# Patient Record
Sex: Female | Born: 1940 | Race: Black or African American | Hispanic: No | Marital: Married | Smoking: Never smoker
Health system: Southern US, Community
[De-identification: ages and names within clinical notes are randomized; demographics above are authoritative.]

## PROBLEM LIST (undated history)

## (undated) DIAGNOSIS — I10 Essential (primary) hypertension: Secondary | ICD-10-CM

## (undated) DIAGNOSIS — K219 Gastro-esophageal reflux disease without esophagitis: Secondary | ICD-10-CM

## (undated) DIAGNOSIS — M1990 Unspecified osteoarthritis, unspecified site: Secondary | ICD-10-CM

## (undated) HISTORY — DX: Unspecified osteoarthritis, unspecified site: M19.90

## (undated) HISTORY — DX: Essential (primary) hypertension: I10

## (undated) HISTORY — DX: Gastro-esophageal reflux disease without esophagitis: K21.9

---

## 1972-08-07 HISTORY — PX: ABDOMINAL HYSTERECTOMY: SHX81

## 1987-08-08 HISTORY — PX: BUNIONECTOMY: SHX129

## 2003-08-21 ENCOUNTER — Ambulatory Visit: Admission: RE | Admit: 2003-08-21 | Discharge: 2003-08-21 | Payer: Self-pay | Admitting: Obstetrics & Gynecology

## 2005-12-23 ENCOUNTER — Emergency Department (HOSPITAL_COMMUNITY): Admission: EM | Admit: 2005-12-23 | Discharge: 2005-12-23 | Payer: Self-pay | Admitting: Emergency Medicine

## 2006-01-23 ENCOUNTER — Ambulatory Visit (HOSPITAL_COMMUNITY): Admission: RE | Admit: 2006-01-23 | Discharge: 2006-01-23 | Payer: Self-pay | Admitting: Family Medicine

## 2006-05-29 ENCOUNTER — Emergency Department (HOSPITAL_COMMUNITY): Admission: EM | Admit: 2006-05-29 | Discharge: 2006-05-29 | Payer: Self-pay | Admitting: Emergency Medicine

## 2006-06-01 ENCOUNTER — Emergency Department (HOSPITAL_COMMUNITY): Admission: EM | Admit: 2006-06-01 | Discharge: 2006-06-01 | Payer: Self-pay | Admitting: Emergency Medicine

## 2007-09-04 ENCOUNTER — Ambulatory Visit (HOSPITAL_COMMUNITY): Admission: RE | Admit: 2007-09-04 | Discharge: 2007-09-04 | Payer: Self-pay | Admitting: Family Medicine

## 2007-09-16 ENCOUNTER — Ambulatory Visit (HOSPITAL_COMMUNITY): Admission: RE | Admit: 2007-09-16 | Discharge: 2007-09-16 | Payer: Self-pay | Admitting: Family Medicine

## 2007-09-19 IMAGING — CR DG CHEST 2V
2 series · 2 of 2 positions shown · non-contrast
Comparison: None.

CLINICAL DATA: 65 year-old-female with dizziness, cough, shortness of breath. 
 CHEST - 2 VIEW:

[view not recorded (1 of 2)]
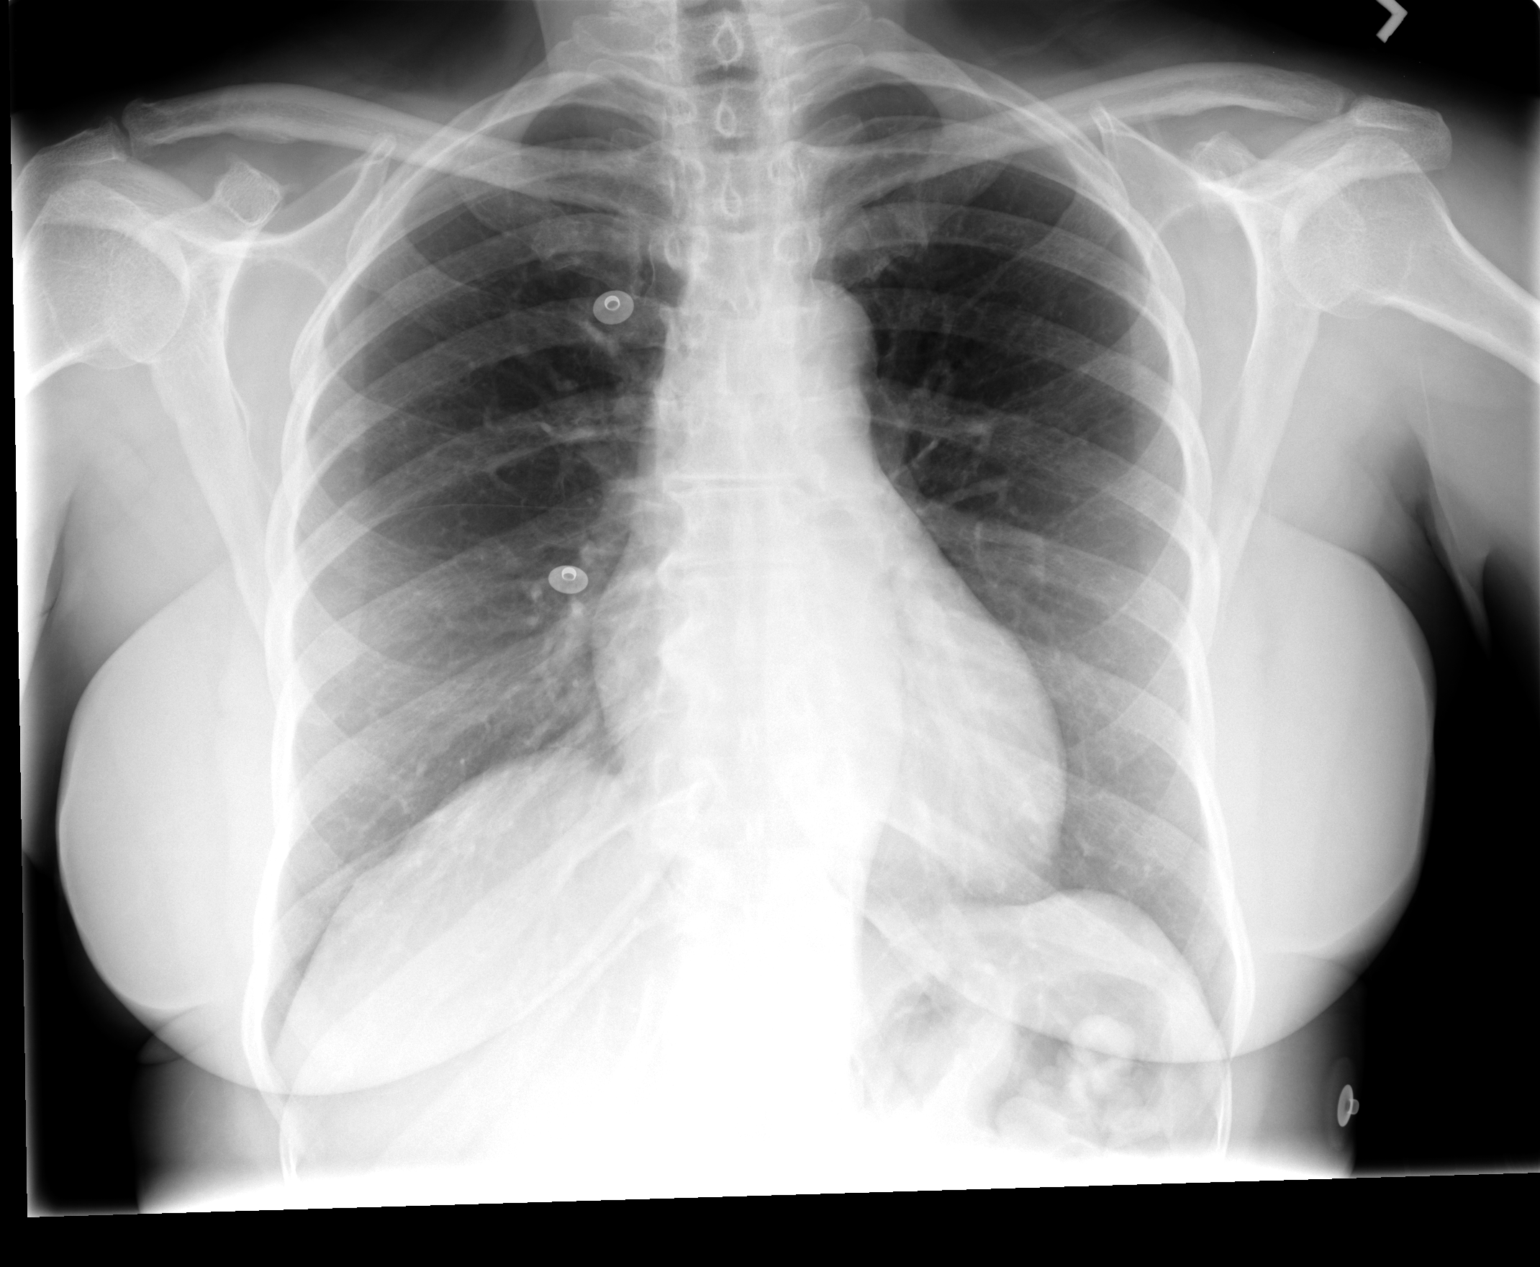

[view not recorded (2 of 2)]
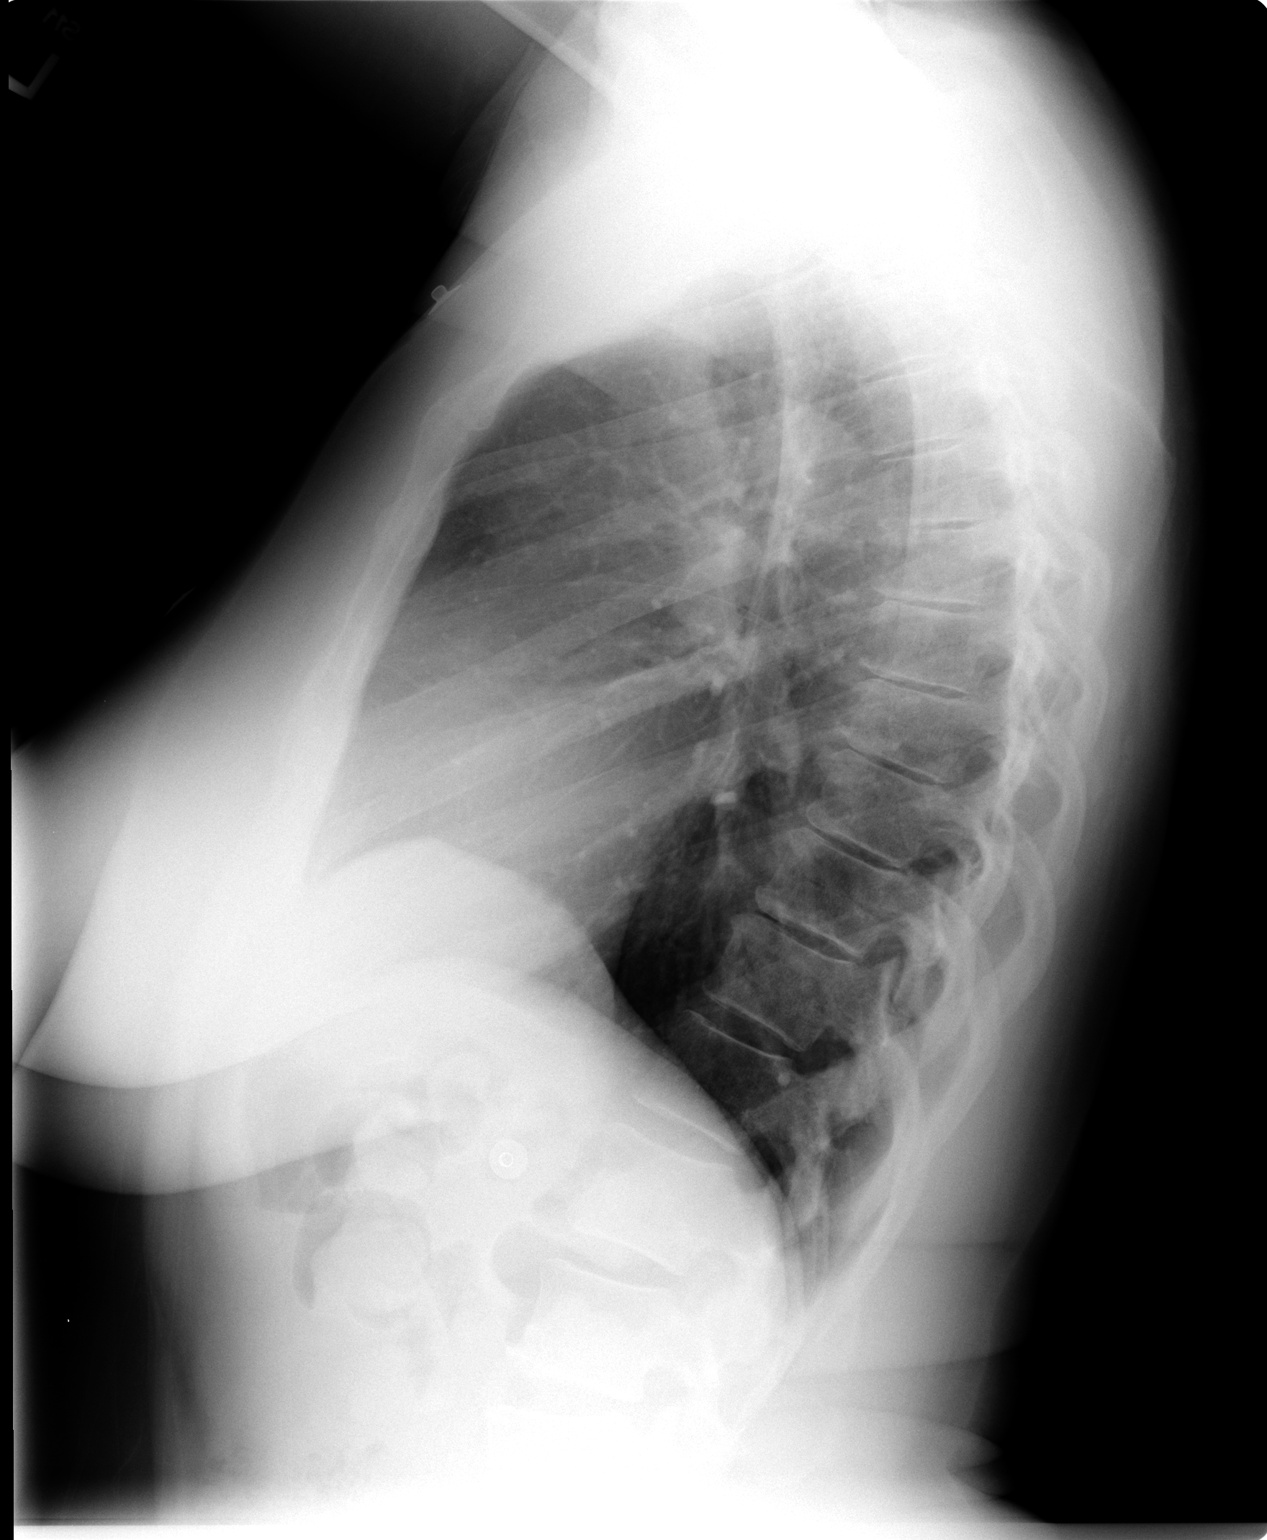

[2 of 2 positions shown; findings below may reference images not displayed]

FINDINGS: The heart size and mediastinal contours are within normal limits.  Both lungs are clear.  The visualized skeletal structures are unremarkable.
IMPRESSION: No active cardiopulmonary disease.

## 2007-09-19 IMAGING — CT CT HEAD W/O CM
1 series · 15 of 28 positions shown, 19 images · IV contrast (agent unspecified)
Comparison: None.

CLINICAL DATA: Dizziness.  Legs feel weak.
 HEAD CT WITHOUT CONTRAST:
TECHNIQUE: Contiguous axial images were obtained from the base of the skull through the vertex, according to standard protocol, without contrast.

[Series 1830: — · axial · 0.49mm/px · z∈[-621,-496]mm · 15 of 28 slices shown, 19 images]
[im 2/28  brain]
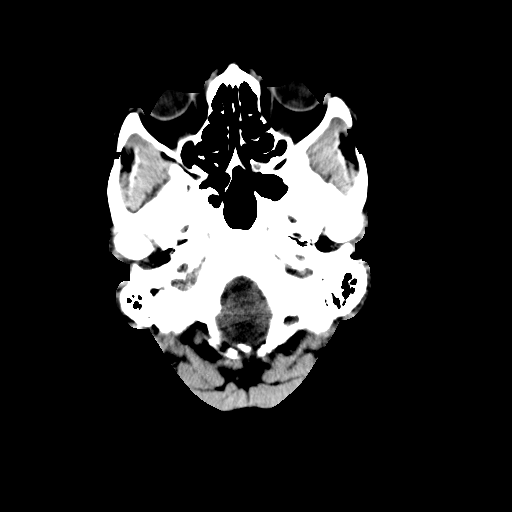
[im 2/28  bone]
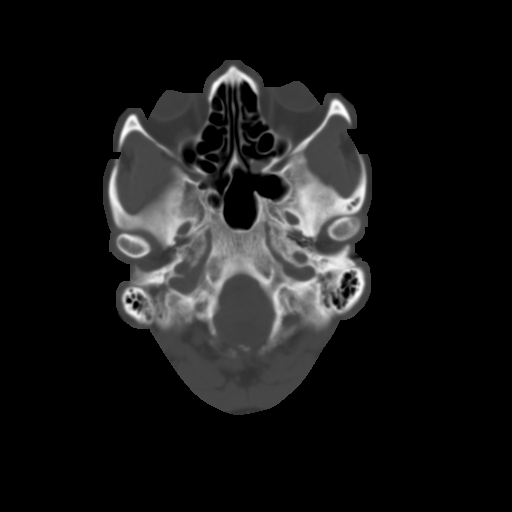
[im 4/28  brain]
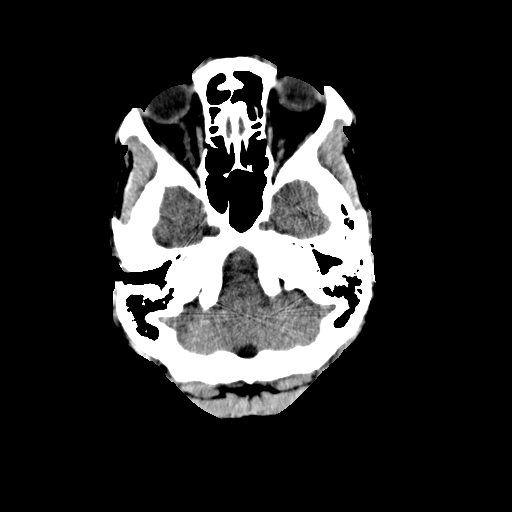
[im 6/28  brain]
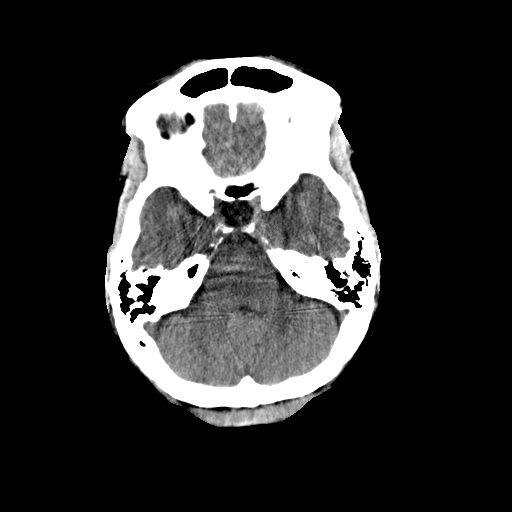
[im 8/28  brain]
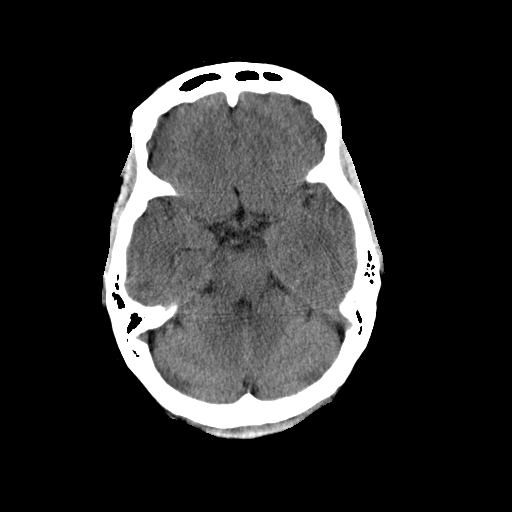
[im 9/28  brain]
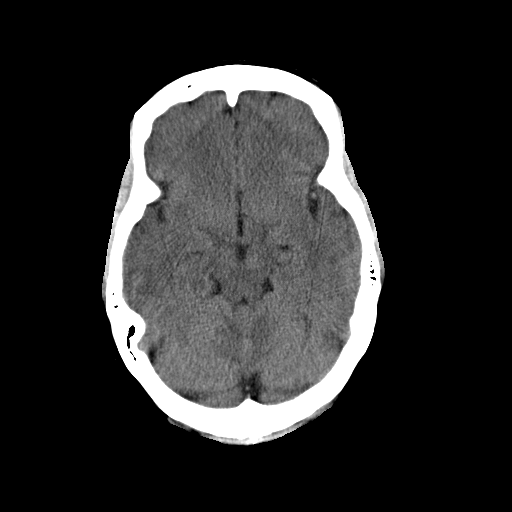
[im 9/28  bone]
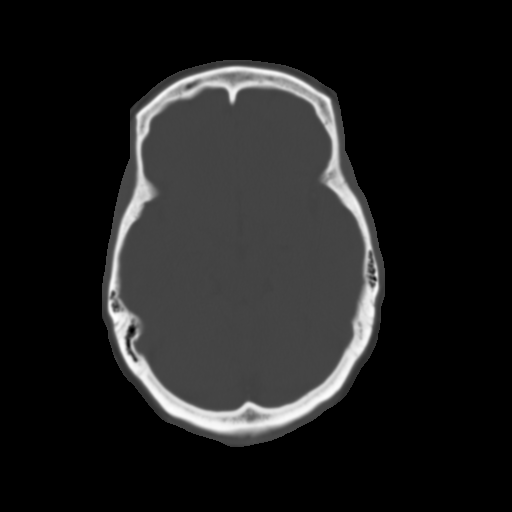
[im 11/28  brain]
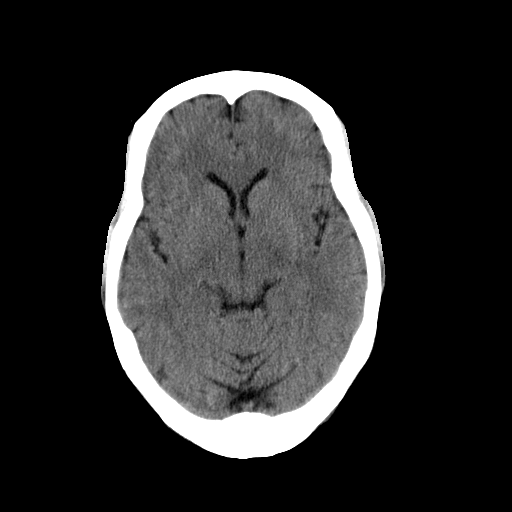
[im 13/28  brain]
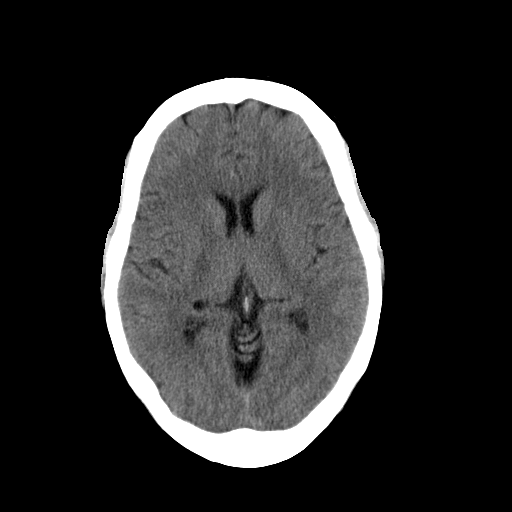
[im 15/28  brain]
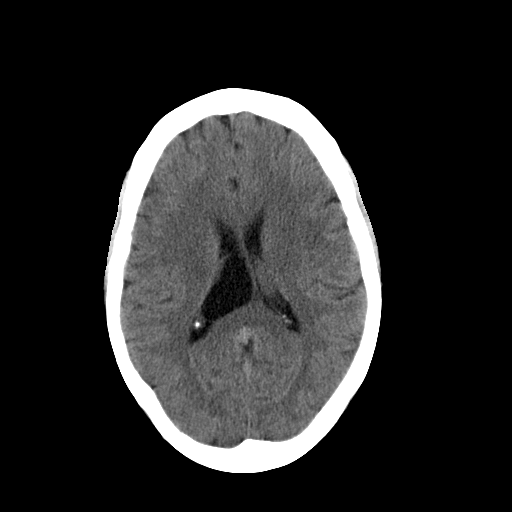
[im 16/28  brain]
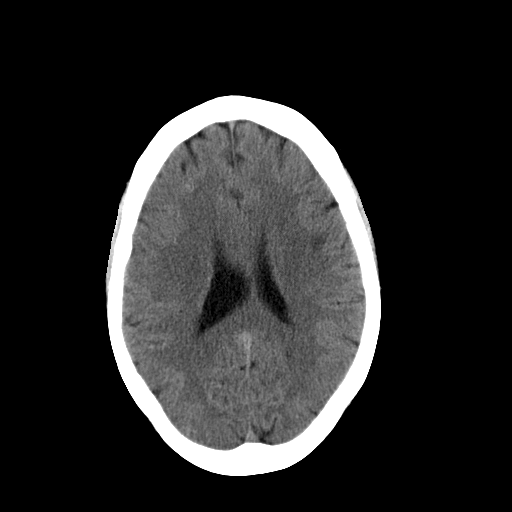
[im 16/28  bone]
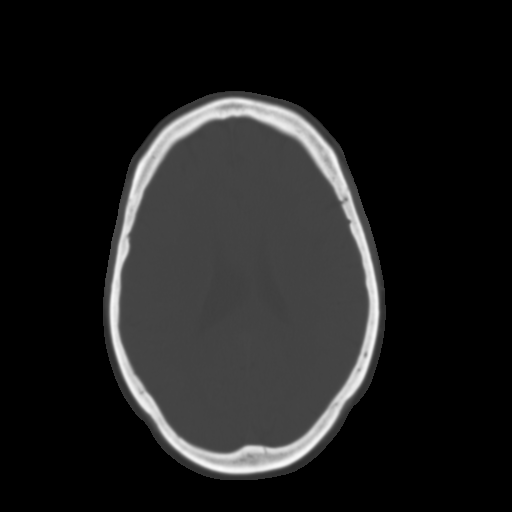
[im 18/28  brain]
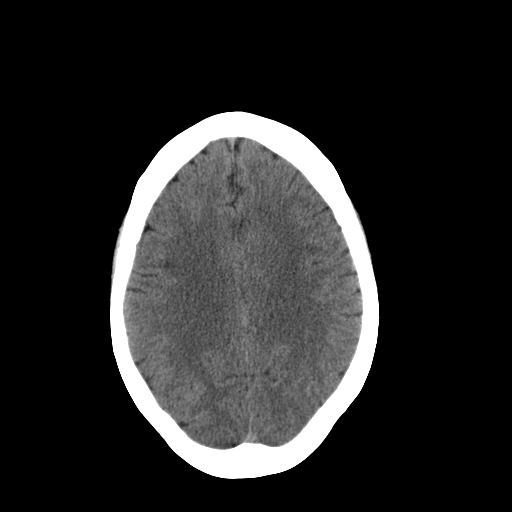
[im 20/28  brain]
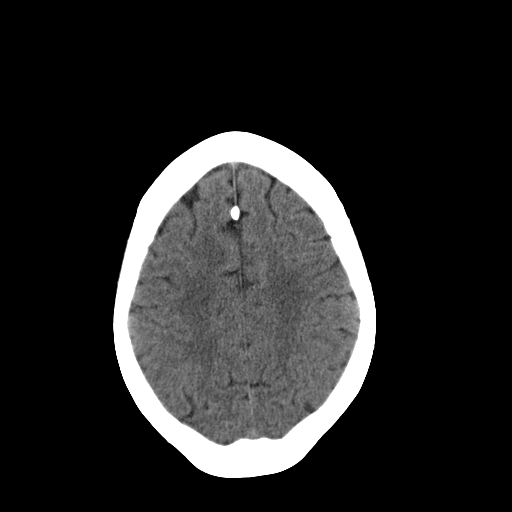
[im 21/28  brain]
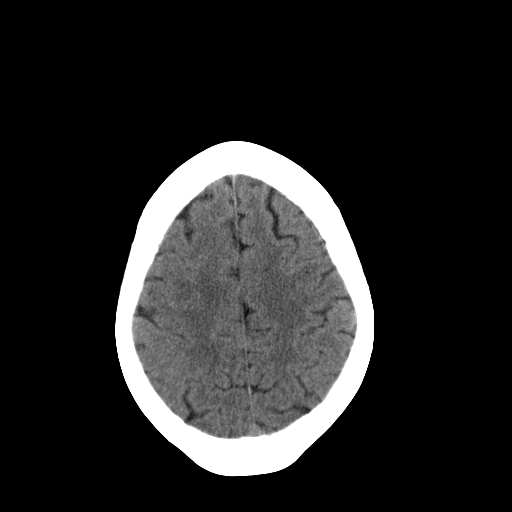
[im 23/28  brain]
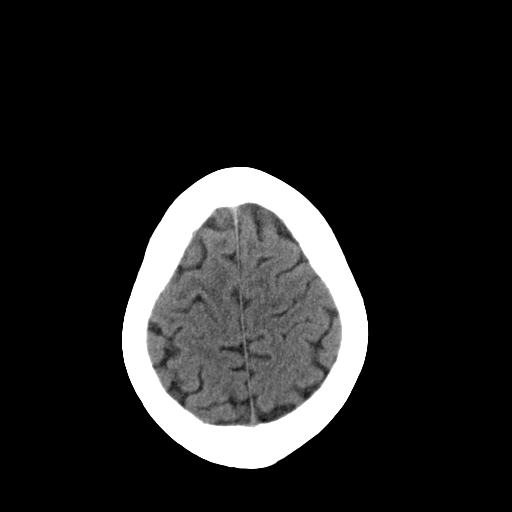
[im 23/28  bone]
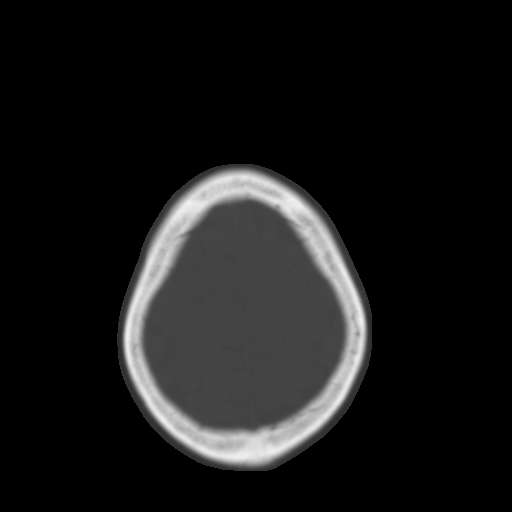
[im 25/28  brain]
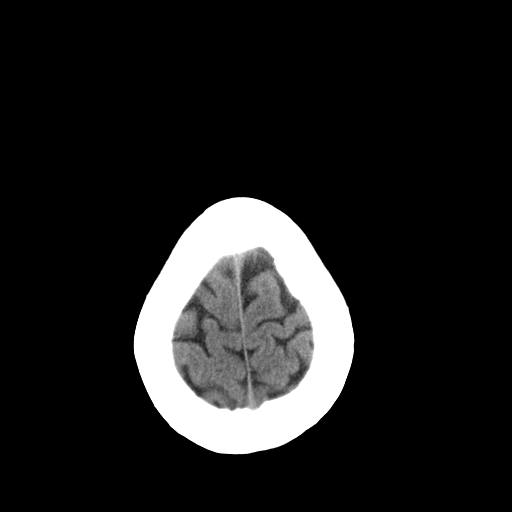
[im 27/28  brain]
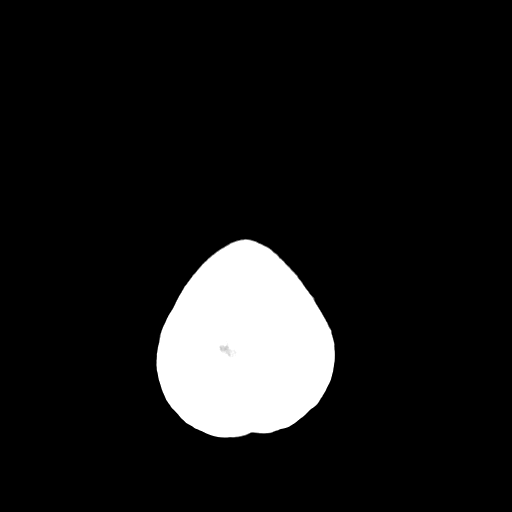

[15 of 28 positions shown; findings below may reference images not displayed]

FINDINGS: The sella turcica is fluid filled.  Partial empty sella may be present.
 There is a 3.4 x 2.2 cm cystic abnormality within the right lateral ventricle, causing mass effect upon adjacent structures such as the splenium of the corpus callosum as well as the septum pellucidum.  There is midline shift to the left, only involving the septum pellucidum.  There is no intracranial hemorrhage.  There is no hydrocephalus.  There are no suspicious areas of low density to suggest acute stroke.  The mastoid air cells and paranasal sinuses are clear.
IMPRESSION: 1.  Partial empty sella may be present.  Further characterize with MR.
 2.  There is a cystic abnormality in the right lateral ventricle causing mass effect upon the septum pellucidum and splenium of the corpus callosum.  MRI is warranted to further characterize.

## 2007-11-05 ENCOUNTER — Ambulatory Visit (HOSPITAL_COMMUNITY): Admission: RE | Admit: 2007-11-05 | Discharge: 2007-11-05 | Payer: Self-pay | Admitting: General Surgery

## 2009-09-27 ENCOUNTER — Encounter: Payer: Self-pay | Admitting: Family Medicine

## 2009-10-07 ENCOUNTER — Ambulatory Visit (HOSPITAL_COMMUNITY): Admission: RE | Admit: 2009-10-07 | Discharge: 2009-10-07 | Payer: Self-pay | Admitting: Family Medicine

## 2009-10-22 ENCOUNTER — Encounter: Payer: Self-pay | Admitting: Family Medicine

## 2010-04-27 ENCOUNTER — Ambulatory Visit: Payer: Self-pay | Admitting: Family Medicine

## 2010-04-27 DIAGNOSIS — I10 Essential (primary) hypertension: Secondary | ICD-10-CM | POA: Insufficient documentation

## 2010-04-27 DIAGNOSIS — K219 Gastro-esophageal reflux disease without esophagitis: Secondary | ICD-10-CM

## 2010-04-27 DIAGNOSIS — R062 Wheezing: Secondary | ICD-10-CM

## 2010-04-27 DIAGNOSIS — R1013 Epigastric pain: Secondary | ICD-10-CM

## 2010-07-27 ENCOUNTER — Ambulatory Visit: Payer: Self-pay | Admitting: Family Medicine

## 2010-08-27 ENCOUNTER — Encounter: Payer: Self-pay | Admitting: Obstetrics & Gynecology

## 2010-08-28 ENCOUNTER — Encounter: Payer: Self-pay | Admitting: Family Medicine

## 2010-09-06 NOTE — Assessment & Plan Note (Signed)
Summary: to be est/ear wax problem/njr   Vital Signs:  Patient profile:   70 year old female Menstrual status:  perimenopausal LMP:     08/07/1973 Height:      64.25 inches Weight:      189 pounds BMI:     32.31 Temp:     98.3 degrees F oral Pulse rate:   72 / minute Pulse rhythm:   regular Resp:     12 per minute BP sitting:   150 / 80  (left arm) Cuff size:   regular  Vitals Entered By: Sid Falcon LPN (April 27, 2010 8:55 AM)  Nutrition Counseling: Patient's BMI is greater than 25 and therefore counseled on weight management options.  CC: New to establish Is Patient Diabetic? No LMP (date): 08/07/1973     Menstrual Status perimenopausal Enter LMP: 08/07/1973   History of Present Illness: New patient to establish care.  Patient has history of hypertension. Reported history of GERD. She has taken PPI intermittently in the past with improvement. Has some mild arthritis issues involving lower extremities. Surgical history for partial hysterectomy 1974. Foot surgery for bunion 1989. No known drug allergies. Current medications reviewed.  Patient has issue of intermittent midepigastric and occasional right upper quadrant abdominal pains off and on for the past several months. Symptoms are actually better now and were improved with PPI. She reports she had MRI upper abdomen from prior physician which was unremarkable. She has not had any postprandial symptoms, nausea, vomiting, or appetite change, weight change, or a stool changes. No history of gallstones.  Recent issues of both ears feel full of pressure. Intermittent tinnitus. No hearing loss. History of wax buildup in the past.  2 years history of some possible expiratory wheezes. Possibly worse at night. Some dyspnea with activity. No chest pain. No cough. Denies history of asthma. No known specific allergies. no hx of PFTs.  nonsmoker.  Preventive Screening-Counseling & Management  Alcohol-Tobacco     Smoking  Status: never  Caffeine-Diet-Exercise     Does Patient Exercise: yes  Past History:  Social History: Last updated: 04/27/2010 Occupation:  Retired Film/video editor Alcohol use-yes Alcohol use-no Regular exercise-yes  Risk Factors: Exercise: yes (04/27/2010)  Risk Factors: Smoking Status: never (04/27/2010)  Past Medical History: Arthritis GERD Hypertension  Past Surgical History: Hysterectomy, partial 1974 Bunionectomy, 1989 PMH-FH-SH reviewed for relevance  Social History: Occupation:  Retired Film/video editor Alcohol use-yes Alcohol use-no Regular exercise-yes Smoking Status:  never Occupation:  employed Does Patient Exercise:  yes  Review of Systems  The patient denies anorexia, fever, weight loss, vision loss, hoarseness, chest pain, syncope, peripheral edema, prolonged cough, headaches, hemoptysis, abdominal pain, melena, hematochezia, severe indigestion/heartburn, incontinence, muscle weakness, and depression.    Physical Exam  General:  Well-developed,well-nourished,in no acute distress; alert,appropriate and cooperative throughout examination Head:  Normocephalic and atraumatic without obvious abnormalities. No apparent alopecia or balding. Eyes:  pupils equal, pupils round, and pupils reactive to light.   Ears:  cerumen impaction bilaterally Irrigated with removal of cerumen. Mouth:  Oral mucosa and oropharynx without lesions or exudates.  Teeth in good repair. Neck:  No deformities, masses, or tenderness noted. Lungs:  Normal respiratory effort, chest expands symmetrically. Lungs are clear to auscultation, no crackles or wheezes. Heart:  normal rate, regular rhythm, and no murmur.   Abdomen:  soft, non-tender, normal bowel sounds, no masses, no guarding, no rigidity, no hepatomegaly, and no splenomegaly.   Msk:  No deformity or scoliosis noted of thoracic or  lumbar spine.   Extremities:  no edema or clubbing Neurologic:  alert & oriented X3,  cranial nerves II-XII intact, and gait normal.   Skin:  no rashes and no suspicious lesions.   Cervical Nodes:  No lymphadenopathy noted Psych:  normally interactive, good eye contact, not anxious appearing, and not depressed appearing.     Impression & Recommendations:  Problem # 1:  HYPERTENSION (ICD-401.9) monitor at home.  Work on weight loss and reassess 3 months. Her updated medication list for this problem includes:    Hydrochlorothiazide 12.5 Mg Caps (Hydrochlorothiazide) ..... Once daily    Metoprolol Tartrate 25 Mg Tabs (Metoprolol tartrate) ..... Once daily  Problem # 2:  GERD (ICD-530.81) Get back on PPI.  Problem # 3:  WHEEZING (ICD-786.07) schedule spirometry.  Problem # 4:  ABDOMINAL PAIN, EPIGASTRIC (ICD-789.06) Get records of prior w/u.  Get back on PPI.  If symptoms not resolved on PPI, GI referral.  Problem # 5:  CERUMEN IMPACTION (ICD-380.4) Assessment: Improved removed with irrigation.  Problem # 6:  Preventive Health Care (ICD-V70.0) flu vaccine given.  Complete Medication List: 1)  Hydrochlorothiazide 12.5 Mg Caps (Hydrochlorothiazide) .... Once daily 2)  Metoprolol Tartrate 25 Mg Tabs (Metoprolol tartrate) .... Once daily  Other Orders: Flu Vaccine 71yrs + MEDICARE PATIENTS (W4132) Administration Flu vaccine - MCR (G4010)  Patient Instructions: 1)  Please schedule a follow-up appointment in 3 months .  2)  Check your  Blood Pressure regularly . If it is above: 140/90  you should make an appointment. 3)  We will call you regarding spirometry (lung function evaluation) 4)  Consider starting back acid reducer Dr Nobie Putnam gave you.  If no relief with that be in touch with me.     Flu Vaccine Consent Questions     Do you have a history of severe allergic reactions to this vaccine? no    Any prior history of allergic reactions to egg and/or gelatin? no    Do you have a sensitivity to the preservative Thimersol? no    Do you have a past history of  Guillan-Barre Syndrome? no    Do you currently have an acute febrile illness? no    Have you ever had a severe reaction to latex? no    Vaccine information given and explained to patient? yes    Are you currently pregnant? no    Lot Number:[id number]   Exp Date:02/04/2011   Site Given  Left Deltoid IMflu

## 2010-09-06 NOTE — Letter (Signed)
Summary: [facility]   Imported By: Maryln Gottron 05/19/2010 14:58:29  _____________________________________________________________________  External Attachment:    Type:   Image     Comment:   External Document

## 2010-09-08 NOTE — Letter (Signed)
Summary: Records from [facility] 2009 - 2011  Records from [facility] 2009 - 2011   Imported By: Maryln Gottron 08/04/2010 10:34:39  _____________________________________________________________________  External Attachment:    Type:   Image     Comment:   External Document

## 2010-09-08 NOTE — Assessment & Plan Note (Signed)
Summary: 3 month rov/njr---PT RSC (BMP) // RS   Vital Signs:  Patient profile:   70 year old female Menstrual status:  perimenopausal Weight:      194 pounds Temp:     98.7 degrees F oral BP sitting:   120 / 86  (left arm) Cuff size:   regular  Vitals Entered By: Sid Falcon LPN (July 27, 2010 2:07 PM)  History of Present Illness: Bilaeral lower extrem pain for 2weeks.  Pain is bilateral.   Radiates from buttock area down post thigh/hamstring bil. First noticed after intercourse. Worse sitting.  Pain is achy.  ?mild weakness. Possible some better with walking.  No injury. No incontinence.  No low back pain.  Pt has separate issue of freq wheezing mostly noted at home and ? worse in her bedroom.  No indoor pets.  No clear triggers.  Symptoms are frequent. Has not tried any meds.  ?hx of mild intermittent asthma.  Symptoms are becoming  more frequent.    BP stable on meds .  Complinat with meds.  Allergies (verified): No Known Drug Allergies  Past History:  Past Medical History: Last updated: 04/27/2010 Arthritis GERD Hypertension  Past Surgical History: Last updated: 04/27/2010 Hysterectomy, partial 1974 Bunionectomy, 1989  Social History: Last updated: 04/27/2010 Occupation:  Retired Film/video editor Alcohol use-yes Alcohol use-no Regular exercise-yes  Risk Factors: Exercise: yes (04/27/2010)  Risk Factors: Smoking Status: never (04/27/2010) PMH-FH-SH reviewed for relevance  Review of Systems  The patient denies anorexia, fever, weight loss, abdominal pain, hematuria, hoarseness, chest pain, syncope, dyspnea on exertion, peripheral edema, prolonged cough, headaches, and hemoptysis.    Physical Exam  General:  Well-developed,well-nourished,in no acute distress; alert,appropriate and cooperative throughout examination Neck:  No deformities, masses, or tenderness noted. Lungs:  Normal respiratory effort, chest expands symmetrically. Lungs are  clear to auscultation, no crackles or wheezes. Heart:  Normal rate and regular rhythm. S1 and S2 normal without gallop, murmur, click, rub or other extra sounds. Extremities:  no edema.  Neg SLRs. Neurologic:  alert & oriented X3, cranial nerves II-XII intact, strength normal in all extremities, and DTRs symmetrical and normal.   Cervical Nodes:  No lymphadenopathy noted Psych:  normally interactive, good eye contact, not anxious appearing, and not depressed appearing.     Impression & Recommendations:  Problem # 1:  PAIN IN SOFT TISSUES OF LIMB (ICD-729.5) no evidence for lumbar stenosis as her pain is not augmented with walking. ?muscular.  Work on some gentle stretches and short term use of Mobic.  Problem # 2:  WHEEZING (ICD-786.07) ?intermittent reactive airways.  discussed possible triggers.  She will try as needed use of  Proventil.  We have rec trial of steroid inhaler if > 2 episodes per week of cough or wheeze Have discusse measures to reduce  dust mite in the bedroom.  Complete Medication List: 1)  Hydrochlorothiazide 12.5 Mg Caps (Hydrochlorothiazide) .... Once daily 2)  Aspirin 81 Mg Tabs (Aspirin) .... Once daily 3)  Metoprolol Tartrate 25 Mg Tabs (Metoprolol tartrate) .... One by mouth two times a day 4)  Proair Hfa 108 (90 Base) Mcg/act Aers (Albuterol sulfate) .... 2 puffs every 4 hours as needed 5)  Meloxicam 15 Mg Tabs (Meloxicam) .... One by mouth once daily  Patient Instructions: 1)  Touch base in 2 weeks if no better. Prescriptions: MELOXICAM 15 MG TABS (MELOXICAM) one by mouth once daily  #30 x 1   Entered and Authorized by:   Evelena Peat MD  Signed by:   Evelena Peat MD on 07/27/2010   Method used:   [facility]        [facility]  [place] [address] [facility]retail[facility]       [address]       [place], [place]  [address]       Ph: [phone] or [phone]       Fax: [phone]   RxID:   [id number] PROAIR HFA 108 (90 BASE) MCG/ACT AERS (ALBUTEROL  SULFATE) 2 puffs every 4 hours as needed  #1 x 1   Entered and Authorized by:   Evelena Peat MD   Signed by:   Evelena Peat MD on 07/27/2010   Method used:   [facility]        [facility]  [place] [address] [facility]       [address]       [place], [place]  [address]       Ph: [phone] or [phone]       Fax: [phone]   RxID:   [id number]    Orders Added: 1)  Est. Patient Level IV [57322]

## 2010-11-07 ENCOUNTER — Other Ambulatory Visit: Payer: Self-pay | Admitting: Family Medicine

## 2010-12-20 NOTE — H&P (Signed)
NAMEGRACEYN, [name]             ACCOUNT NO.:  [id number]   MEDICAL RECORD NO.:  [id number]          PATIENT TYPE:  AMB   LOCATION:  DAY                           FACILITY:  [facility]   PHYSICIAN:  Dalia Heading, M.D.  DATE OF BIRTH:  [birth year]   DATE OF ADMISSION:  DATE OF DISCHARGE:  LH                              HISTORY & PHYSICAL   CHIEF COMPLAINT:  Need for screening colonoscopy.   HISTORY OF PRESENT ILLNESS:  The patient is a 70 year old black female  who is referred for endoscopic evaluation.  She needs a colonoscopy for  screening purposes.  No abdominal pain, weight loss, nausea, vomiting,  diarrhea, constipation, melena, hematochezia had been noted.  She has  never had a colonoscopy.  There is no family history of colon carcinoma.   PAST MEDICAL HISTORY:  Unremarkable.   PAST SURGICAL HISTORY:  Hysterectomy.   CURRENT MEDICATIONS:  None.   ALLERGIES:  No known drug allergies.   REVIEW OF SYSTEMS:  Noncontributory.   PHYSICAL EXAMINATION:  GENERAL:  The patient is a well-developed, well-  nourished, black female in no acute distress.  LUNGS:  Clear to auscultation with equal breath sounds bilaterally.  HEART:  Regular rate and rhythm without S3, S4, or murmurs.  ABDOMEN:  Soft, nontender, and nondistended.  No hepatosplenomegaly or  masses are noted.  RECTAL:  Deferred to the procedure.   IMPRESSION:  Need for screening colonoscopy.   PLAN:  The patient was scheduled for a colonoscopy on November 05, 2007.  The risks and benefits of the procedure including bleeding and  perforation were fully explained to the patient, who gave informed  consent.      Dalia Heading, M.D.  Electronically Signed     MAJ/MEDQ  D:  10/15/2007  T:  10/16/2007  Job:  [id number]   cc:   Patrica Duel, M.D.  Fax: [phone]

## 2011-01-12 ENCOUNTER — Other Ambulatory Visit: Payer: Self-pay | Admitting: Family Medicine

## 2011-01-12 MED ORDER — HYDROCHLOROTHIAZIDE 12.5 MG PO CAPS: 12.5000 mg | ORAL_CAPSULE | Freq: Every day | ORAL | Status: DC

## 2011-01-12 MED ORDER — METOPROLOL TARTRATE 25 MG PO TABS: 25.0000 mg | ORAL_TABLET | Freq: Two times a day (BID) | ORAL | Status: DC

## 2011-01-12 NOTE — Telephone Encounter (Signed)
Rx sent, pt informed. 

## 2011-01-12 NOTE — Telephone Encounter (Signed)
Pt has sch emp in July. Pt called and is req renewal of HCTZ 12.5 mg 1 a day, Metoprolol 25 mg bid. Pt is completely out of both meds. Pt req that these be called in today.  [facility]s [facility].

## 2011-02-14 ENCOUNTER — Encounter: Payer: Self-pay | Admitting: Family Medicine

## 2011-02-14 ENCOUNTER — Telehealth: Payer: Self-pay | Admitting: Family Medicine

## 2011-02-14 NOTE — Telephone Encounter (Signed)
Pt called 7/10. Has appt with Dr. Leonard Schwartz on Friday. Had extra BP meds called in last week to get her through, but it was not enough and by Sunday she was out again. Calling now for more to get there through Friday.

## 2011-02-15 MED ORDER — HYDROCHLOROTHIAZIDE 12.5 MG PO CAPS: 12.5000 mg | ORAL_CAPSULE | Freq: Every day | ORAL | Status: DC

## 2011-02-15 MED ORDER — METOPROLOL TARTRATE 25 MG PO TABS: 25.0000 mg | ORAL_TABLET | Freq: Two times a day (BID) | ORAL | Status: DC

## 2011-02-15 NOTE — Telephone Encounter (Signed)
Pt informed metoprolol and HCTZ sent for an additional month

## 2011-02-17 ENCOUNTER — Encounter: Payer: Self-pay | Admitting: Family Medicine

## 2011-02-27 ENCOUNTER — Other Ambulatory Visit: Payer: Self-pay | Admitting: Family Medicine

## 2011-03-09 ENCOUNTER — Encounter: Payer: Self-pay | Admitting: Family Medicine

## 2011-03-09 ENCOUNTER — Ambulatory Visit (INDEPENDENT_AMBULATORY_CARE_PROVIDER_SITE_OTHER): Payer: Medicare Other | Admitting: Family Medicine

## 2011-03-09 DIAGNOSIS — Z13220 Encounter for screening for lipoid disorders: Secondary | ICD-10-CM

## 2011-03-09 DIAGNOSIS — I10 Essential (primary) hypertension: Secondary | ICD-10-CM

## 2011-03-09 DIAGNOSIS — J4520 Mild intermittent asthma, uncomplicated: Secondary | ICD-10-CM | POA: Insufficient documentation

## 2011-03-09 DIAGNOSIS — Z0000 Encounter for general adult medical examination without abnormal findings: Secondary | ICD-10-CM

## 2011-03-09 DIAGNOSIS — M1990 Unspecified osteoarthritis, unspecified site: Secondary | ICD-10-CM

## 2011-03-09 LAB — BASIC METABOLIC PANEL
BUN: 21 mg/dL (ref 6–23)
Chloride: 107 mEq/L (ref 96–112)
Glucose, Bld: 92 mg/dL (ref 70–99)
Potassium: 4.1 mEq/L (ref 3.5–5.1)

## 2011-03-09 LAB — CBC WITH DIFFERENTIAL/PLATELET
Basophils Relative: 0.4 % (ref 0.0–3.0)
Eosinophils Absolute: 0.1 10*3/uL (ref 0.0–0.7)
Eosinophils Relative: 3.9 % (ref 0.0–5.0)
Lymphocytes Relative: 36.7 % (ref 12.0–46.0)
MCHC: 32.6 g/dL (ref 30.0–36.0)
Neutrophils Relative %: 52.5 % (ref 43.0–77.0)
Platelets: 228 10*3/uL (ref 150.0–400.0)
RBC: 4.2 Mil/uL (ref 3.87–5.11)
WBC: 3.4 10*3/uL — ABNORMAL LOW (ref 4.5–10.5)

## 2011-03-09 LAB — HEPATIC FUNCTION PANEL
ALT: 18 U/L (ref 0–35)
Bilirubin, Direct: 0.1 mg/dL (ref 0.0–0.3)
Total Bilirubin: 0.8 mg/dL (ref 0.3–1.2)

## 2011-03-09 LAB — LIPID PANEL
LDL Cholesterol: 95 mg/dL (ref 0–99)
VLDL: 11 mg/dL (ref 0.0–40.0)

## 2011-03-09 LAB — TSH: TSH: 1.86 u[IU]/mL (ref 0.35–5.50)

## 2011-03-09 MED ORDER — ALBUTEROL SULFATE HFA 108 (90 BASE) MCG/ACT IN AERS: 2.0000 | INHALATION_SPRAY | Freq: Four times a day (QID) | RESPIRATORY_TRACT | Status: DC | PRN

## 2011-03-09 MED ORDER — METOPROLOL TARTRATE 25 MG PO TABS: 25.0000 mg | ORAL_TABLET | Freq: Two times a day (BID) | ORAL | Status: DC

## 2011-03-09 MED ORDER — HYDROCHLOROTHIAZIDE 12.5 MG PO CAPS: 12.5000 mg | ORAL_CAPSULE | Freq: Every day | ORAL | Status: DC

## 2011-03-09 MED ORDER — MELOXICAM 15 MG PO TABS: 15.0000 mg | ORAL_TABLET | Freq: Every day | ORAL | Status: DC

## 2011-03-09 NOTE — Patient Instructions (Signed)
Continue weight loss and exercise efforts Continue yearly flu vaccine

## 2011-03-09 NOTE — Progress Notes (Signed)
Subjective:    Patient ID: [name], female    DOB: [birth year], 70 y.o.   MRN: [id number]  HPI Patient here for Medicare wellness exam and followup of chronic medical problems.  She has hypertension treated with hydrochlorothiazide and metoprolol. Blood pressure stable by home readings. Lifestyle changes with decreased calories and increased exercise and she has lost some weight due to her efforts this year.  She has history of mild intermittent asthma and possibly some exercise-induced asthma. Uses albuterol for that and needs refills. She has history of some mild osteoarthritis and takes meloxicam. No gastrointestinal side effects.  Patient sees gynecologist regularly. Tetanus less than 10 years ago. Pneumovax around age 81.  No hx of shingles vaccine. Colonoscopy 7 years ago. Mammogram and DEXA scan have been scheduled per her gynecologist.  She has occasional palpitations but no recent chest pain, dyspnea, or syncope.  1.  Risk factors based on Past Medical , Social, and Family history  all reviewed and as below. No history of smoking 2.  Limitations in physical activities very active with exercise. Very low risk of falls 3.  Depression/mood no depression or anxiety issues 4.  Hearing intact 5.  ADLs fully independent in all 6.  Cognitive function (orientation to time and place, language, writing, speech,memory) no short or long-term memory deficits. No language deficits 7.  Home Safety no issues identified 8.  Height, weight, and visual acuity. Recent weight loss due to her efforts. No vision changes or concerns 9.  Counseling continue weight loss and exercise efforts. Yearly flu vaccine 10. Recommendation of preventive services. Obtain screening labs. Continue yearly flu vaccine. Check on coverage for shingles vaccine  11. Labs based on risk factors TSH, metabolic panel, lipids, CBC, and hepatic 12. Care Plan  As above.  Past Medical History  Diagnosis Date  . Arthritis     . GERD (gastroesophageal reflux disease)   . Hypertension    Past Surgical History  Procedure Date  . Abdominal hysterectomy 1974    partial  . Bunionectomy 1989    reports that she has never smoked. She does not have any smokeless tobacco history on file. Her alcohol and drug histories not on file. family history is not on file. No Known Allergies    Review of Systems  Constitutional: Negative for fever, activity change, appetite change and fatigue.  HENT: Negative for hearing loss, ear pain, sore throat and trouble swallowing.   Eyes: Negative for visual disturbance.  Respiratory: Negative for cough and shortness of breath.   Cardiovascular: Positive for palpitations. Negative for chest pain.  Gastrointestinal: Negative for abdominal pain, diarrhea, constipation and blood in stool.  Genitourinary: Negative for dysuria and hematuria.  Musculoskeletal: Positive for arthralgias. Negative for myalgias and back pain.  Skin: Negative for rash.  Neurological: Negative for dizziness, syncope and headaches.  Hematological: Negative for adenopathy.  Psychiatric/Behavioral: Negative for confusion and dysphoric mood.       Objective:   Physical Exam  Constitutional: She is oriented to person, place, and time. She appears well-developed and well-nourished.  HENT:  Head: Normocephalic and atraumatic.  Eyes: EOM are normal. Pupils are equal, round, and reactive to light.  Neck: Normal range of motion. Neck supple. No thyromegaly present.  Cardiovascular: Normal rate, regular rhythm and normal heart sounds.   No murmur heard. Pulmonary/Chest: Breath sounds normal. No respiratory distress. She has no wheezes. She has no rales.  Abdominal: Soft. Bowel sounds are normal. She exhibits no distension and no  mass. There is no tenderness. There is no rebound and no guarding.  Musculoskeletal: Normal range of motion. She exhibits no edema.  Lymphadenopathy:    She has no cervical adenopathy.   Neurological: She is alert and oriented to person, place, and time. She displays normal reflexes. No cranial nerve deficit.  Skin: No rash noted.  Psychiatric: She has a normal mood and affect. Her behavior is normal. Judgment and thought content normal.          Assessment & Plan:  #1 health maintenance. Schedule labs. Baseline EKG. Check on shingles vaccine coverage. Continue exercise and weight loss efforts #2 hypertension stable refill medications for one year  #3 mild intermittent asthma. Refill albuterol with caution against overuse #4 history of mild osteoarthritis. Try to use meloxicam sparingly

## 2011-03-10 NOTE — Progress Notes (Signed)
Quick Note:  Pt informed on home VM ______ 

## 2011-03-14 ENCOUNTER — Other Ambulatory Visit (HOSPITAL_COMMUNITY): Payer: Self-pay | Admitting: Obstetrics

## 2011-03-14 DIAGNOSIS — N952 Postmenopausal atrophic vaginitis: Secondary | ICD-10-CM

## 2011-03-27 ENCOUNTER — Telehealth: Payer: Self-pay | Admitting: Family Medicine

## 2011-03-27 MED ORDER — METOPROLOL TARTRATE 25 MG PO TABS: 25.0000 mg | ORAL_TABLET | Freq: Two times a day (BID) | ORAL | Status: DC

## 2011-03-27 MED ORDER — ALBUTEROL SULFATE HFA 108 (90 BASE) MCG/ACT IN AERS: 2.0000 | INHALATION_SPRAY | Freq: Four times a day (QID) | RESPIRATORY_TRACT | Status: AC | PRN

## 2011-03-27 MED ORDER — MELOXICAM 15 MG PO TABS: 15.0000 mg | ORAL_TABLET | Freq: Every day | ORAL | Status: AC

## 2011-03-27 MED ORDER — HYDROCHLOROTHIAZIDE 12.5 MG PO CAPS: 12.5000 mg | ORAL_CAPSULE | Freq: Every day | ORAL | Status: DC

## 2011-03-27 NOTE — Telephone Encounter (Signed)
All Rx sent to [facility]

## 2011-03-27 NOTE — Telephone Encounter (Signed)
Pt called 8/20. She was here 7/13. The [facility] that were called in for [facility] that [facility] [address] She says that per her insurance, they need to go through [employer]. Please send through [employer]. Please call pt if any questions.

## 2011-07-12 ENCOUNTER — Ambulatory Visit (INDEPENDENT_AMBULATORY_CARE_PROVIDER_SITE_OTHER): Payer: Medicare Other | Admitting: Family Medicine

## 2011-07-12 DIAGNOSIS — Z23 Encounter for immunization: Secondary | ICD-10-CM

## 2011-07-13 ENCOUNTER — Ambulatory Visit: Payer: Medicare Other | Admitting: Family Medicine

## 2012-03-18 ENCOUNTER — Telehealth: Payer: Self-pay | Admitting: Family Medicine

## 2012-03-18 NOTE — Telephone Encounter (Signed)
Caller: Senaida/Patient; Patient Name: [name]; PCP: Evelena Peat; Best Callback Phone Number: [phone]. Pain in back,  right buttocks and back of thigh. Onset "quite a while".  Relates she has appointment for 8/23 with Dr. Caryl Never and does not feel she can wait that long.  Pain meds do not help.  Pain rated up to 9 of 10, currently at 5 of 10.  Emergent symptoms ruled out.  Home care for the interim and parameters for callback given per Back Symptoms protocol.  Appointment at 1500 with Adline Mango on 03/19/12 as none available with Dr. Caryl Never.  Home care for the interim and parameters for callback given.

## 2012-03-19 ENCOUNTER — Ambulatory Visit (INDEPENDENT_AMBULATORY_CARE_PROVIDER_SITE_OTHER): Payer: Medicare Other | Admitting: Family

## 2012-03-19 ENCOUNTER — Encounter: Payer: Self-pay | Admitting: Family

## 2012-03-19 VITALS — BP 140/88 | Temp 98.5°F | Wt 189.0 lb

## 2012-03-19 DIAGNOSIS — M5416 Radiculopathy, lumbar region: Secondary | ICD-10-CM

## 2012-03-19 DIAGNOSIS — M545 Low back pain, unspecified: Secondary | ICD-10-CM

## 2012-03-19 DIAGNOSIS — R200 Anesthesia of skin: Secondary | ICD-10-CM

## 2012-03-19 DIAGNOSIS — IMO0002 Reserved for concepts with insufficient information to code with codable children: Secondary | ICD-10-CM

## 2012-03-19 DIAGNOSIS — R209 Unspecified disturbances of skin sensation: Secondary | ICD-10-CM

## 2012-03-19 MED ORDER — PREDNISONE 20 MG PO TABS: ORAL_TABLET | ORAL | Status: DC

## 2012-03-19 NOTE — Patient Instructions (Signed)

## 2012-03-20 ENCOUNTER — Telehealth: Payer: Self-pay | Admitting: Speech Pathology

## 2012-03-20 ENCOUNTER — Encounter: Payer: Self-pay | Admitting: Family

## 2012-03-20 MED ORDER — PREDNISONE 20 MG PO TABS: ORAL_TABLET | ORAL | Status: AC

## 2012-03-20 NOTE — Progress Notes (Signed)
Subjective:    Patient ID: [name], female    DOB: 05-Jun-1941, 71 y.o.   MRN: [id number]  HPI 71 year old [place] American female, nonsmoker, patient of Dr. Caryl Never is in today with complaints of pain that radiates down the back of both eyes. She also has numbness to the front of her right leg. The discomfort is especially worse in the morning. Rates it a 9/10 and out of 10 in the mornings. As the day progresses it gets better. She had x-ray of the L-spine 2 years ago that suggest have an MRI but she never had done. Overall the numbness is worsening. Denies any injury. Denies any known injury. She's been taken Mobic 15 mg daily and has even increased it to 30 mg with no relief.   Review of Systems  Constitutional: Negative.   Respiratory: Negative.   Cardiovascular: Negative.   Genitourinary: Negative.   Musculoskeletal: Positive for myalgias, back pain and arthralgias.  Neurological: Positive for numbness.       Numbness to the front of the right thigh  Hematological: Negative.   Psychiatric/Behavioral: Negative.    Past Medical History  Diagnosis Date  . Arthritis   . GERD (gastroesophageal reflux disease)   . Hypertension     History   Social History  . Marital Status: Married    Spouse Name: N/A    Number of Children: N/A  . Years of Education: N/A   Occupational History  . Not on file.   Social History Main Topics  . Smoking status: Never Smoker   . Smokeless tobacco: Not on file  . Alcohol Use: Not on file  . Drug Use: Not on file  . Sexually Active: Not on file   Other Topics Concern  . Not on file   Social History Narrative  . No narrative on file    Past Surgical History  Procedure Date  . Abdominal hysterectomy 1974    partial  . Bunionectomy 1989    No family history on file.  No Known Allergies  Current Outpatient Prescriptions on File Prior to Visit  Medication Sig Dispense Refill  . albuterol (PROAIR HFA) 108 (90 BASE) MCG/ACT  inhaler Inhale 2 puffs into the lungs every 6 (six) hours as needed.  1 Inhaler  5  . aspirin 81 MG tablet Take 81 mg by mouth daily.        . hydrochlorothiazide (MICROZIDE) 12.5 MG capsule Take 1 capsule (12.5 mg total) by mouth daily.  90 capsule  3  . meloxicam (MOBIC) 15 MG tablet Take 1 tablet (15 mg total) by mouth daily.  90 tablet  3  . metoprolol tartrate (LOPRESSOR) 25 MG tablet Take 1 tablet (25 mg total) by mouth 2 (two) times daily.  180 tablet  3    BP 140/88  Temp 98.5 F (36.9 C) (Oral)  Wt 189 lb (85.73 kg)chart    Objective:   Physical Exam  Constitutional: She is oriented to person, place, and time. She appears well-developed and well-nourished.  Neck: Normal range of motion. Neck supple.  Cardiovascular: Normal rate, regular rhythm and normal heart sounds.   Pulmonary/Chest: Effort normal and breath sounds normal.  Abdominal: Soft. Bowel sounds are normal.  Neurological: She is alert and oriented to person, place, and time. She has normal reflexes.  Skin: Skin is warm and dry.  Psychiatric: She has a normal mood and affect.          Assessment & Plan:  Assessment: Lumbar radiculopathy,  numbness to the lower extremities  Plan: MRI of the L-spine will be scheduled. Would treat patient with prednisone for her symptoms. Patient call the office if symptoms worsen or persist. Recheck a schedule, and when necessary.

## 2012-03-20 NOTE — Telephone Encounter (Signed)
Pt called and said Oran Rein was going to call in her prescription for Prednisone right away.  Pt went to the  pharmacy and they said there was not an Rx for her.  She is requesting [facility] be sent to [facility] [place].  Please call her asap and let her know the status of this Rx.  Thanks.

## 2012-03-20 NOTE — Telephone Encounter (Signed)
Spoke to the pt.  First rx was sent to [facility] by mistake.  [facility] in [place].  Called [facility] and cancelled the order.

## 2012-03-23 ENCOUNTER — Ambulatory Visit
Admission: RE | Admit: 2012-03-23 | Discharge: 2012-03-23 | Disposition: A | Discharge status: Home / Self Care | Payer: Medicare Other | Source: Ambulatory Visit | Attending: Family | Admitting: Family

## 2012-03-23 DIAGNOSIS — M545 Low back pain: Secondary | ICD-10-CM

## 2012-03-23 DIAGNOSIS — R200 Anesthesia of skin: Secondary | ICD-10-CM

## 2012-03-23 DIAGNOSIS — M5416 Radiculopathy, lumbar region: Secondary | ICD-10-CM

## 2012-03-26 ENCOUNTER — Telehealth: Payer: Self-pay | Admitting: Family Medicine

## 2012-03-26 DIAGNOSIS — M549 Dorsalgia, unspecified: Secondary | ICD-10-CM

## 2012-03-26 NOTE — Telephone Encounter (Signed)
Pt called req mri results. Pls call.

## 2012-03-27 NOTE — Telephone Encounter (Signed)
Spinal stenosis at several levels but disc protrusion L2-L3 compressing nerve root.  I would rec neurosurgical referral to [facility] neurosurgery group if patient is agreeable.

## 2012-03-27 NOTE — Telephone Encounter (Signed)
Patient is aware and referral

## 2012-03-29 ENCOUNTER — Ambulatory Visit (INDEPENDENT_AMBULATORY_CARE_PROVIDER_SITE_OTHER): Payer: Medicare Other | Admitting: Family Medicine

## 2012-03-29 ENCOUNTER — Encounter: Payer: Self-pay | Admitting: Family Medicine

## 2012-03-29 VITALS — BP 130/80 | Temp 98.4°F | Ht 64.5 in | Wt 186.0 lb

## 2012-03-29 DIAGNOSIS — R635 Abnormal weight gain: Secondary | ICD-10-CM

## 2012-03-29 DIAGNOSIS — Z0000 Encounter for general adult medical examination without abnormal findings: Secondary | ICD-10-CM

## 2012-03-29 DIAGNOSIS — R5383 Other fatigue: Secondary | ICD-10-CM

## 2012-03-29 DIAGNOSIS — K219 Gastro-esophageal reflux disease without esophagitis: Secondary | ICD-10-CM

## 2012-03-29 DIAGNOSIS — M1990 Unspecified osteoarthritis, unspecified site: Secondary | ICD-10-CM

## 2012-03-29 DIAGNOSIS — I10 Essential (primary) hypertension: Secondary | ICD-10-CM

## 2012-03-29 LAB — CBC WITH DIFFERENTIAL/PLATELET
Basophils Relative: 0.4 % (ref 0.0–3.0)
Eosinophils Relative: 1.7 % (ref 0.0–5.0)
HCT: 40.8 % (ref 36.0–46.0)
Lymphs Abs: 3.5 10*3/uL (ref 0.7–4.0)
MCV: 94.5 fl (ref 78.0–100.0)
Monocytes Absolute: 0.8 10*3/uL (ref 0.1–1.0)
Neutro Abs: 4.9 10*3/uL (ref 1.4–7.7)
RBC: 4.31 Mil/uL (ref 3.87–5.11)
WBC: 9.3 10*3/uL (ref 4.5–10.5)

## 2012-03-29 LAB — HEPATIC FUNCTION PANEL
ALT: 15 U/L (ref 0–35)
AST: 18 U/L (ref 0–37)
Albumin: 4.1 g/dL (ref 3.5–5.2)

## 2012-03-29 LAB — BASIC METABOLIC PANEL
BUN: 22 mg/dL (ref 6–23)
Calcium: 9.4 mg/dL (ref 8.4–10.5)
GFR: 85.68 mL/min (ref >60.00)
Glucose, Bld: 84 mg/dL (ref 70–99)
Sodium: 145 mEq/L (ref 135–145)

## 2012-03-29 LAB — LIPID PANEL: HDL: 91.5 mg/dL (ref >39.00)

## 2012-03-29 MED ORDER — HYDROCHLOROTHIAZIDE 12.5 MG PO CAPS: 12.5000 mg | ORAL_CAPSULE | Freq: Every day | ORAL | Status: DC

## 2012-03-29 MED ORDER — METOPROLOL TARTRATE 25 MG PO TABS: 25.0000 mg | ORAL_TABLET | Freq: Two times a day (BID) | ORAL | Status: DC

## 2012-03-29 MED ORDER — TRAMADOL HCL 50 MG PO TABS: ORAL_TABLET | ORAL | Status: DC

## 2012-03-29 NOTE — Progress Notes (Signed)
Subjective:    Patient ID: [name], female    DOB: February 11, 1941, 71 y.o.   MRN: [id number]  HPI  Patient seen for Medicare wellness exam and followup medical problems. She has hypertension, history of mild intermittent asthma, GERD, and osteoarthritis. Asthma stable.  Recent back difficulties. MRI scan revealed multilevel degenerative arthritis with spinal stenosis.  She has had progressive back pain and occasional radiculopathy symptoms. Been scheduled with neurosurgeon. No urine incontinence. No lower extremity weakness. Back pain has greatly limited her exercise and she's had some increased fatigue and weight gain.  She sees gynecologist regularly. No history of shingles vaccine. Had previous Pneumovax. Receives yearly flu vaccine. She is getting mammograms and Pap smears through gynecologist.  Past Medical History  Diagnosis Date  . Arthritis   . GERD (gastroesophageal reflux disease)   . Hypertension    Past Surgical History  Procedure Date  . Abdominal hysterectomy 1974    partial  . Bunionectomy 1989    reports that she has never smoked. She does not have any smokeless tobacco history on file. Her alcohol and drug histories not on file. family history is not on file. No Known Allergies  1.  Risk factors based on Past Medical , Social, and Family history reviewed and as above 2.  Limitations in physical activities recent activities limited by low back pain. No recent fall 3.  Depression/mood no depression or anxiety issues 4.  Hearing intact 5.  ADLs fully independent in all 6.  Cognitive function (orientation to time and place, language, writing, speech,memory) no memory deficits. Language and judgment intact 7.  Home Safety no issues 8.  Height, weight, and visual acuity. Vision stable. Weight unchanged 9.  Counseling discussed importance of weight control and trying to establish more consistent exercise 10. Recommendation of preventive services. Check on insurance  coverage for shingles vaccine. Check on date of last colonoscopy 11. Labs based on risk factors lipids, basic metabolic panel, hepatic, TSH, and CBC 12. Care Plan as above.     Review of Systems  Constitutional: Positive for unexpected weight change. Negative for appetite change and fatigue.  HENT: Negative for trouble swallowing and voice change.   Eyes: Negative for visual disturbance.  Respiratory: Negative for cough, chest tightness, shortness of breath and wheezing.   Cardiovascular: Negative for chest pain, palpitations and leg swelling.  Gastrointestinal: Negative for nausea, vomiting, abdominal pain and constipation.  Genitourinary: Negative for dysuria.  Musculoskeletal: Positive for back pain and arthralgias. Negative for joint swelling.  Skin: Negative for rash.  Neurological: Negative for dizziness, seizures, syncope, weakness, light-headedness and headaches.  Psychiatric/Behavioral: Negative for confusion and dysphoric mood.       Objective:   Physical Exam  Constitutional: She is oriented to person, place, and time. She appears well-developed and well-nourished.  HENT:  Right Ear: External ear normal.  Left Ear: External ear normal.  Mouth/Throat: Oropharynx is clear and moist.  Eyes: Pupils are equal, round, and reactive to light.  Neck: Neck supple. No thyromegaly present.  Cardiovascular: Normal rate and regular rhythm.   Pulmonary/Chest: Effort normal and breath sounds normal. No respiratory distress. She has no wheezes. She has no rales.  Abdominal: Soft. Bowel sounds are normal. She exhibits no distension and no mass. There is no tenderness. There is no rebound and no guarding.  Musculoskeletal: She exhibits no edema.  Lymphadenopathy:    She has no cervical adenopathy.  Neurological: She is alert and oriented to person, place, and time. No  cranial nerve deficit.  Psychiatric: She has a normal mood and affect. Her behavior is normal.            Assessment & Plan:  #1 health maintenance. Check on date of last colonoscopy. Check insurance coverage for shingles vaccine. She'll continue to see gynecologist for pap smear.  flu vaccine this fall #2 hypertension. Meds refilled. Work on weight loss and continue close monitoring #3 history of mild intermittent asthma. Stable. When necessary albuterol #4 lumbar stenosis. She's been scheduled with neurosurgeon.

## 2012-03-29 NOTE — Patient Instructions (Addendum)
Check on coverage for shingles vaccine Continue yearly flu vaccine Check on date of last colonoscopy Work on weight loss

## 2012-04-01 ENCOUNTER — Other Ambulatory Visit: Payer: Self-pay | Admitting: Family

## 2012-04-01 DIAGNOSIS — M4310 Spondylolisthesis, site unspecified: Secondary | ICD-10-CM

## 2012-04-01 DIAGNOSIS — IMO0002 Reserved for concepts with insufficient information to code with codable children: Secondary | ICD-10-CM

## 2012-04-01 NOTE — Progress Notes (Signed)
Quick Note:  Called and spoke with pt and pt is aware. ______ 

## 2012-07-25 ENCOUNTER — Other Ambulatory Visit: Payer: Self-pay | Admitting: Family Medicine

## 2012-09-25 ENCOUNTER — Telehealth: Payer: Self-pay | Admitting: *Deleted

## 2012-09-25 MED ORDER — TRAMADOL HCL 50 MG PO TABS: ORAL_TABLET | ORAL | Status: DC

## 2012-09-25 MED ORDER — HYDROCHLOROTHIAZIDE 12.5 MG PO CAPS: 12.5000 mg | ORAL_CAPSULE | Freq: Every day | ORAL | Status: DC

## 2012-09-25 NOTE — Telephone Encounter (Signed)
Office follow up to discuss.  If she is taking tramadol, there is some increased risk of serotonin syndrome with escalating dose.

## 2012-09-25 NOTE — Telephone Encounter (Signed)
Pt informed on cell VM and husband informed at home

## 2012-09-25 NOTE — Telephone Encounter (Signed)
Pt was prescribed Gabapentin 100 mg by another provider where they used to live.  She is currently taking 1 tab bid, questioning if she could take TID, or perhaps move up to stronger dose?  Other meds already filled

## 2012-11-04 ENCOUNTER — Other Ambulatory Visit: Payer: Self-pay | Admitting: Family Medicine

## 2012-11-05 ENCOUNTER — Telehealth: Payer: Self-pay | Admitting: Family Medicine

## 2012-11-05 MED ORDER — METOPROLOL TARTRATE 25 MG PO TABS: ORAL_TABLET | ORAL | Status: DC

## 2012-11-05 NOTE — Telephone Encounter (Signed)
Pt needs new rx metoprolol 25 mg # 30 with refill sent to [facility]

## 2013-01-17 ENCOUNTER — Encounter: Payer: Self-pay | Admitting: Family Medicine

## 2013-01-17 ENCOUNTER — Ambulatory Visit (INDEPENDENT_AMBULATORY_CARE_PROVIDER_SITE_OTHER): Payer: Medicare Other | Admitting: Family Medicine

## 2013-01-17 VITALS — BP 140/88 | Temp 98.8°F

## 2013-01-17 DIAGNOSIS — H6123 Impacted cerumen, bilateral: Secondary | ICD-10-CM

## 2013-01-17 DIAGNOSIS — H6120 Impacted cerumen, unspecified ear: Secondary | ICD-10-CM

## 2013-01-17 NOTE — Progress Notes (Signed)
  Subjective:    Patient ID: [name], female    DOB: 1941-02-08, 72 y.o.   MRN: [id number]  HPI Bilateral ear fullness left greater than right over the past several weeks She describes cold-like symptoms back at the end of May. Nasal congestive symptoms have improved. No ear drainage. No vertigo. No ear pain. She's had possibly some very mild hearing loss left year compared to right Denies nasal congestion at this time  Past Medical History  Diagnosis Date  . Arthritis   . GERD (gastroesophageal reflux disease)   . Hypertension    Past Surgical History  Procedure Laterality Date  . Abdominal hysterectomy  1974    partial  . Bunionectomy  1989    reports that she has never smoked. She does not have any smokeless tobacco history on file. Her alcohol and drug histories are not on file. family history is not on file. No Known Allergies    Review of Systems  Constitutional: Negative for fever and chills.  HENT: Negative for ear pain, tinnitus and ear discharge.   Neurological: Negative for dizziness and headaches.       Objective:   Physical Exam  Constitutional: She appears well-developed and well-nourished.  HENT:  Mouth/Throat: Oropharynx is clear and moist.  Bilateral cerumen impactions  Neck: Neck supple. No thyromegaly present.  Cardiovascular: Normal rate and regular rhythm.   Pulmonary/Chest: Effort normal and breath sounds normal. No respiratory distress. She has no wheezes. She has no rales.  Lymphadenopathy:    She has no cervical adenopathy.          Assessment & Plan:  Bilateral cerumen impactions. Irrigation with removal of cerumen.  Pt tolerated well. We discussed measures to try to reduce build up.

## 2013-01-17 NOTE — Patient Instructions (Addendum)
Cerumen Impaction A cerumen impaction is when the wax in your ear forms a plug. This plug usually causes reduced hearing. Sometimes it also causes an earache or dizziness. Removing a cerumen impaction can be difficult and painful. The wax sticks to the ear canal. The canal is sensitive and bleeds easily. If you try to remove a heavy wax buildup with a cotton tipped swab, you may push it in further. Irrigation with water, suction, and small ear curettes may be used to clear out the wax. If the impaction is fixed to the skin in the ear canal, ear drops may be needed for a few days to loosen the wax. People who build up a lot of wax frequently can use ear wax removal products available in your local drugstore. SEEK MEDICAL CARE IF:  You develop an earache, increased hearing loss, or marked dizziness. Document Released: 08/31/2004 Document Revised: 10/16/2011 Document Reviewed: 10/21/2009 Exit[employer] Patient Information 2014 [employer].  

## 2013-04-11 ENCOUNTER — Telehealth: Payer: Self-pay

## 2013-04-11 MED ORDER — TRAMADOL HCL 50 MG PO TABS: ORAL_TABLET | ORAL | Status: AC

## 2013-04-11 NOTE — Telephone Encounter (Signed)
Refill for 6 months. 

## 2013-04-11 NOTE — Telephone Encounter (Signed)
Tramadol  Last visit 01/17/13 Last refill 09/25/12 #60 6 refills  [facility]

## 2013-04-11 NOTE — Telephone Encounter (Signed)
Called in rx

## 2013-08-11 ENCOUNTER — Encounter: Payer: Medicare Other | Admitting: Family Medicine

## 2013-08-14 ENCOUNTER — Other Ambulatory Visit: Payer: Self-pay | Admitting: Family Medicine

## 2013-08-20 ENCOUNTER — Encounter: Payer: Self-pay | Admitting: Family Medicine

## 2013-08-20 ENCOUNTER — Ambulatory Visit (INDEPENDENT_AMBULATORY_CARE_PROVIDER_SITE_OTHER): Payer: Medicare Other | Admitting: Family Medicine

## 2013-08-20 VITALS — BP 140/76 | HR 66 | Temp 98.4°F | Ht 64.0 in | Wt 189.0 lb

## 2013-08-20 DIAGNOSIS — M48061 Spinal stenosis, lumbar region without neurogenic claudication: Secondary | ICD-10-CM

## 2013-08-20 DIAGNOSIS — K219 Gastro-esophageal reflux disease without esophagitis: Secondary | ICD-10-CM

## 2013-08-20 DIAGNOSIS — R5383 Other fatigue: Secondary | ICD-10-CM

## 2013-08-20 DIAGNOSIS — Z23 Encounter for immunization: Secondary | ICD-10-CM

## 2013-08-20 DIAGNOSIS — M1990 Unspecified osteoarthritis, unspecified site: Secondary | ICD-10-CM

## 2013-08-20 DIAGNOSIS — E669 Obesity, unspecified: Secondary | ICD-10-CM | POA: Insufficient documentation

## 2013-08-20 DIAGNOSIS — I10 Essential (primary) hypertension: Secondary | ICD-10-CM

## 2013-08-20 DIAGNOSIS — E785 Hyperlipidemia, unspecified: Secondary | ICD-10-CM

## 2013-08-20 DIAGNOSIS — Z0000 Encounter for general adult medical examination without abnormal findings: Secondary | ICD-10-CM

## 2013-08-20 DIAGNOSIS — R5381 Other malaise: Secondary | ICD-10-CM

## 2013-08-20 LAB — BASIC METABOLIC PANEL
BUN: 14 mg/dL (ref 6–23)
CHLORIDE: 107 meq/L (ref 96–112)
CO2: 32 meq/L (ref 19–32)
CREATININE: 0.8 mg/dL (ref 0.4–1.2)
Calcium: 9.7 mg/dL (ref 8.4–10.5)
GFR: 91.62 mL/min (ref >60.00)
GLUCOSE: 97 mg/dL (ref 70–99)
POTASSIUM: 4.8 meq/L (ref 3.5–5.1)
Sodium: 144 mEq/L (ref 135–145)

## 2013-08-20 LAB — CBC WITH DIFFERENTIAL/PLATELET
BASOS ABS: 0 10*3/uL (ref 0.0–0.1)
BASOS PCT: 0.8 % (ref 0.0–3.0)
EOS ABS: 0.1 10*3/uL (ref 0.0–0.7)
Eosinophils Relative: 3.1 % (ref 0.0–5.0)
HCT: 38.9 % (ref 36.0–46.0)
Hemoglobin: 12.9 g/dL (ref 12.0–15.0)
LYMPHS PCT: 39.6 % (ref 12.0–46.0)
Lymphs Abs: 1.7 10*3/uL (ref 0.7–4.0)
MCHC: 33.2 g/dL (ref 30.0–36.0)
MCV: 91.8 fl (ref 78.0–100.0)
MONOS PCT: 9.1 % (ref 3.0–12.0)
Monocytes Absolute: 0.4 10*3/uL (ref 0.1–1.0)
NEUTROS PCT: 47.4 % (ref 43.0–77.0)
Neutro Abs: 2 10*3/uL (ref 1.4–7.7)
Platelets: 236 10*3/uL (ref 150.0–400.0)
RBC: 4.24 Mil/uL (ref 3.87–5.11)
RDW: 13.7 % (ref 11.5–14.6)
WBC: 4.2 10*3/uL — ABNORMAL LOW (ref 4.5–10.5)

## 2013-08-20 LAB — LIPID PANEL
CHOL/HDL RATIO: 3
CHOLESTEROL: 192 mg/dL (ref 0–200)
HDL: 76.7 mg/dL (ref >39.00)
LDL CALC: 101 mg/dL — AB (ref 0–99)
Triglycerides: 73 mg/dL (ref 0.0–149.0)
VLDL: 14.6 mg/dL (ref 0.0–40.0)

## 2013-08-20 LAB — HEPATIC FUNCTION PANEL
ALK PHOS: 60 U/L (ref 39–117)
ALT: 19 U/L (ref 0–35)
AST: 28 U/L (ref 0–37)
Albumin: 4.2 g/dL (ref 3.5–5.2)
BILIRUBIN DIRECT: 0 mg/dL (ref 0.0–0.3)
BILIRUBIN TOTAL: 0.7 mg/dL (ref 0.3–1.2)
Total Protein: 7.1 g/dL (ref 6.0–8.3)

## 2013-08-20 LAB — TSH: TSH: 1.76 u[IU]/mL (ref 0.35–5.50)

## 2013-08-20 NOTE — Progress Notes (Signed)
Pre visit review using our clinic review tool, if applicable. No additional management support is needed unless otherwise documented below in the visit note. 

## 2013-08-20 NOTE — Patient Instructions (Signed)
Consider application such as myfitnesspanel.com to help gauge calorie intake and energy expenditure. Consider flu vaccine Let us know if you change your mind regarding shingles vaccine

## 2013-08-20 NOTE — Progress Notes (Signed)
Subjective:    Patient ID: [name], female    DOB: [birth year], 73 y.o.   MRN: [id number]  HPI Patient here for Medicare wellness exam and medical followup Her prior problems include history of mild intermittent asthma, GERD, osteoarthritis, lumbar stenosis, and hypertension. Blood pressures been fairly well controlled. She's been limited somewhat exercise because of her chronic low back pain. She is followed elsewhere for that. Blood pressure is controlled with hydrochlorothiazide and metoprolol. No dizziness. No headaches. No chest pains.  Some bilateral ear fullness and she's had cerumen impactions in the past. She still sees gynecologist for Pap smears and breast exams. Last tetanus 10 years ago. No flu vaccine yet and she is uncertain whether she wishes to pursue. Colonoscopy up to date. She declines shingles vaccine  Past Medical History  Diagnosis Date  . Arthritis   . GERD (gastroesophageal reflux disease)   . Hypertension    Past Surgical History  Procedure Laterality Date  . Abdominal hysterectomy  1974    partial  . Bunionectomy  1989    reports that she has never smoked. She does not have any smokeless tobacco history on file. Her alcohol and drug histories are not on file. family history includes Cancer (age of onset: 49) in her brother; Heart disease (age of onset: 29) in her mother. No Known Allergies  1.  Risk factors based on Past Medical , Social, and Family history reviewed and as above 2.  Limitations in physical activities no limitation in activities other than related to her back pain. No recent falls 3.  Depression/mood no depression or anxiety issues 4.  Hearing no major changes 5.  ADLs independent in all 6.  Cognitive function (orientation to time and place, language, writing, speech,memory) no memory deficits. Language and judgment intact. 7.  Home Safety no issues identified 8.  Height, weight, and visual acuity. She has a mild weight gain in  recent years. Vision stable 9.  Counseling discussed weight loss and we specifically gave her free applications that she can download help track her calories and energy expenditure 10. Recommendation of preventive services. Prevnar 13, Tdap.  Flu vaccine recommended and declined. She declines shingles vaccine 11. Labs based on risk factors lipid, hepatic, basic metabolic panel, CBC 12. Care Plan as above    Review of Systems  Constitutional: Positive for fatigue. Negative for fever, activity change, appetite change and unexpected weight change.  HENT: Negative for ear pain, hearing loss, sore throat and trouble swallowing.   Eyes: Negative for visual disturbance.  Respiratory: Negative for cough and shortness of breath.   Cardiovascular: Negative for chest pain and palpitations.  Gastrointestinal: Negative for abdominal pain, diarrhea, constipation and blood in stool.  Endocrine: Negative for polydipsia and polyuria.  Genitourinary: Negative for dysuria and hematuria.  Musculoskeletal: Negative for arthralgias, back pain and myalgias.  Skin: Negative for rash.  Neurological: Negative for dizziness, syncope and headaches.  Hematological: Negative for adenopathy.  Psychiatric/Behavioral: Negative for confusion and dysphoric mood.       Objective:   Physical Exam  Constitutional: She is oriented to person, place, and time. She appears well-developed and well-nourished.  HENT:  Head: Normocephalic and atraumatic.  Cerumen impactions bilaterally removed with irrigation  Eyes: EOM are normal. Pupils are equal, round, and reactive to light.  Neck: Normal range of motion. Neck supple. No thyromegaly present.  Cardiovascular: Normal rate, regular rhythm and normal heart sounds.   No murmur heard. Pulmonary/Chest: Breath sounds normal. No  respiratory distress. She has no wheezes. She has no rales.  Abdominal: Soft. Bowel sounds are normal. She exhibits no distension and no mass. There is no  tenderness. There is no rebound and no guarding.  Genitourinary:  Per GYN  Musculoskeletal: Normal range of motion. She exhibits no edema.  Lymphadenopathy:    She has no cervical adenopathy.  Neurological: She is alert and oriented to person, place, and time. She displays normal reflexes. No cranial nerve deficit.  Skin: No rash noted.  Psychiatric: She has a normal mood and affect. Her behavior is normal. Judgment and thought content normal.          Assessment & Plan:  #1 health maintenance. Tetanus booster given. Prevnar 13 given. Flu vaccine recommended and declined. Shingles vaccine declined. Colonoscopy up to date. She'll continue GYN followup regarding mammograms and Pap smears #2 cerumen impaction. Irrigation bilaterally #3 hypertension which is stable and at goal. Discussed weight loss #4 obesity. We discussed weight loss strategies

## 2013-08-21 ENCOUNTER — Telehealth: Payer: Self-pay | Admitting: Family Medicine

## 2013-08-21 NOTE — Telephone Encounter (Signed)
Relevant patient education assigned to patient using Emmi. ° °

## 2013-11-12 ENCOUNTER — Other Ambulatory Visit (HOSPITAL_COMMUNITY): Payer: Self-pay | Admitting: Obstetrics

## 2013-11-12 DIAGNOSIS — Z780 Asymptomatic menopausal state: Secondary | ICD-10-CM

## 2013-11-12 DIAGNOSIS — Z139 Encounter for screening, unspecified: Secondary | ICD-10-CM

## 2013-11-12 DIAGNOSIS — Z1231 Encounter for screening mammogram for malignant neoplasm of breast: Secondary | ICD-10-CM

## 2013-11-20 ENCOUNTER — Ambulatory Visit (HOSPITAL_COMMUNITY): Payer: Medicare Other

## 2013-11-20 ENCOUNTER — Ambulatory Visit (HOSPITAL_COMMUNITY)
Admission: RE | Admit: 2013-11-20 | Discharge: 2013-11-20 | Disposition: A | Discharge status: Home / Self Care | Payer: Medicare Other | Source: Ambulatory Visit | Attending: Obstetrics | Admitting: Obstetrics

## 2013-11-20 DIAGNOSIS — Z780 Asymptomatic menopausal state: Secondary | ICD-10-CM | POA: Insufficient documentation

## 2013-11-20 DIAGNOSIS — Z1231 Encounter for screening mammogram for malignant neoplasm of breast: Secondary | ICD-10-CM | POA: Insufficient documentation

## 2014-03-10 ENCOUNTER — Telehealth: Payer: Self-pay | Admitting: Family Medicine

## 2014-03-10 NOTE — Telephone Encounter (Signed)
Pt request refill of the following:     PROAIR HFA 108 (90 BASE) MCG/ACT inhaler  Pt said she has not found a doctor yet and would like to know if she can have one more refil    Pham[facility]: [facility]   [place]  [address]    Fax number [phone]

## 2014-03-11 MED ORDER — ALBUTEROL SULFATE HFA 108 (90 BASE) MCG/ACT IN AERS: INHALATION_SPRAY | RESPIRATORY_TRACT | Status: AC

## 2014-03-11 NOTE — Telephone Encounter (Signed)
Rx sent to pharmacy   

## 2016-05-26 ENCOUNTER — Other Ambulatory Visit: Payer: Self-pay | Admitting: Family Medicine
